# Patient Record
Sex: Male | Born: 1965 | Race: White | Hispanic: No | Marital: Married | Smoking: Former smoker
Health system: Southern US, Community
[De-identification: ages and names within clinical notes are randomized; demographics above are authoritative.]

## PROBLEM LIST (undated history)

## (undated) DIAGNOSIS — R519 Headache, unspecified: Secondary | ICD-10-CM

## (undated) DIAGNOSIS — G8929 Other chronic pain: Secondary | ICD-10-CM

## (undated) DIAGNOSIS — J449 Chronic obstructive pulmonary disease, unspecified: Secondary | ICD-10-CM

## (undated) DIAGNOSIS — Z9981 Dependence on supplemental oxygen: Secondary | ICD-10-CM

## (undated) DIAGNOSIS — J342 Deviated nasal septum: Secondary | ICD-10-CM

## (undated) DIAGNOSIS — R51 Headache: Secondary | ICD-10-CM

## (undated) DIAGNOSIS — E785 Hyperlipidemia, unspecified: Secondary | ICD-10-CM

## (undated) DIAGNOSIS — J3489 Other specified disorders of nose and nasal sinuses: Secondary | ICD-10-CM

## (undated) DIAGNOSIS — M549 Dorsalgia, unspecified: Secondary | ICD-10-CM

## (undated) DIAGNOSIS — K219 Gastro-esophageal reflux disease without esophagitis: Secondary | ICD-10-CM

## (undated) HISTORY — PX: KNEE SURGERY: SHX244

---

## 1997-08-03 ENCOUNTER — Emergency Department (HOSPITAL_COMMUNITY): Admission: EM | Admit: 1997-08-03 | Discharge: 1997-08-04 | Payer: Self-pay | Admitting: Emergency Medicine

## 1998-03-19 ENCOUNTER — Encounter: Payer: Self-pay | Admitting: Emergency Medicine

## 1998-03-19 ENCOUNTER — Emergency Department (HOSPITAL_COMMUNITY): Admission: EM | Admit: 1998-03-19 | Discharge: 1998-03-19 | Payer: Self-pay | Admitting: Emergency Medicine

## 2000-05-23 ENCOUNTER — Emergency Department (HOSPITAL_COMMUNITY): Admission: EM | Admit: 2000-05-23 | Discharge: 2000-05-23 | Payer: Self-pay | Admitting: *Deleted

## 2001-01-12 ENCOUNTER — Emergency Department (HOSPITAL_COMMUNITY): Admission: EM | Admit: 2001-01-12 | Discharge: 2001-01-12 | Payer: Self-pay | Admitting: Emergency Medicine

## 2001-01-28 ENCOUNTER — Ambulatory Visit (HOSPITAL_COMMUNITY): Admission: RE | Admit: 2001-01-28 | Discharge: 2001-01-28 | Payer: Self-pay | Admitting: Gastroenterology

## 2001-02-23 ENCOUNTER — Ambulatory Visit (HOSPITAL_COMMUNITY): Admission: RE | Admit: 2001-02-23 | Discharge: 2001-02-23 | Payer: Self-pay | Admitting: Gastroenterology

## 2010-09-13 ENCOUNTER — Other Ambulatory Visit: Payer: Self-pay | Admitting: Family Medicine

## 2010-09-13 DIAGNOSIS — R51 Headache: Secondary | ICD-10-CM

## 2010-09-14 ENCOUNTER — Ambulatory Visit
Admission: RE | Admit: 2010-09-14 | Discharge: 2010-09-14 | Disposition: A | Discharge status: Home / Self Care | Payer: PRIVATE HEALTH INSURANCE | Source: Ambulatory Visit | Attending: Family Medicine | Admitting: Family Medicine

## 2010-09-14 ENCOUNTER — Other Ambulatory Visit: Payer: Self-pay

## 2010-09-14 DIAGNOSIS — R51 Headache: Secondary | ICD-10-CM

## 2014-06-14 ENCOUNTER — Other Ambulatory Visit: Payer: Self-pay | Admitting: Family Medicine

## 2014-06-14 DIAGNOSIS — R51 Headache: Principal | ICD-10-CM

## 2014-06-14 DIAGNOSIS — R519 Headache, unspecified: Secondary | ICD-10-CM

## 2014-06-23 ENCOUNTER — Ambulatory Visit
Admission: RE | Admit: 2014-06-23 | Discharge: 2014-06-23 | Disposition: A | Discharge status: Home / Self Care | Payer: Medicare Other | Source: Ambulatory Visit | Attending: Family Medicine | Admitting: Family Medicine

## 2014-06-23 DIAGNOSIS — R519 Headache, unspecified: Secondary | ICD-10-CM

## 2014-06-23 DIAGNOSIS — R51 Headache: Principal | ICD-10-CM

## 2014-06-23 MED ORDER — GADOBENATE DIMEGLUMINE 529 MG/ML IV SOLN
20.0000 mL | Freq: Once | INTRAVENOUS | Status: AC | PRN
  Administered 2014-06-23: 20 mL via INTRAVENOUS

## 2017-02-21 ENCOUNTER — Ambulatory Visit: Payer: Self-pay | Admitting: Otolaryngology

## 2017-02-21 NOTE — H&P (Signed)
PREOPERATIVE H&P  Chief Complaint: Nasal obstruction  HPI: Tyler ScotMichael E Sobocinski is a 52 y.o. male who presents for evaluation of nasal obstruction and headaches. He also snores on a regular. He's been having headaches on top of his head for the last several weeks and has occasional clear drainage from his nose. Nasal exam reveals severely deviated septum but no evidence of active infection. He's taken to the operating room at this time for septoplasty and turbinate reductions. He has some clinical symptoms of obstructive sleep apnea. He's gained approximately 30 pounds over the last year since he stopped smoking.  No past medical history on file.  Social History   Socioeconomic History  . Marital status: Married    Spouse name: Not on file  . Number of children: Not on file  . Years of education: Not on file  . Highest education level: Not on file  Social Needs  . Financial resource strain: Not on file  . Food insecurity - worry: Not on file  . Food insecurity - inability: Not on file  . Transportation needs - medical: Not on file  . Transportation needs - non-medical: Not on file  Occupational History  . Not on file  Tobacco Use  . Smoking status: Not on file  Substance and Sexual Activity  . Alcohol use: Not on file  . Drug use: Not on file  . Sexual activity: Not on file  Other Topics Concern  . Not on file  Social History Narrative  . Not on file   No family history on file. Allergies not on file Prior to Admission medications   Not on File     Positive ROS: Per history of present illness  All other systems have been reviewed and were otherwise negative with the exception of those mentioned in the HPI and as above.  Physical Exam: There were no vitals filed for this visit.  General: Alert, no acute distress Oral: Normal oral mucosa and tonsils Nasal: Severe septal deviation to the right. No polyps noted. No signs of infection. Neck: No palpable adenopathy or  thyroid nodules Ear: Ear canal is clear with normal appearing TMs Cardiovascular: Regular rate and rhythm, no murmur.  Respiratory: Clear to auscultation Neurologic: Alert and oriented x 3   Assessment/Plan: deviated septum  chronic sinusitis Plan for Procedure(s): NASAL SEPTOPLASTY WITH BILATERAL TURBINATE REDUCTION   Dillard Cannonhristopher Page Pucciarelli, MD 02/21/2017 4:50 PM

## 2017-02-24 ENCOUNTER — Encounter (HOSPITAL_BASED_OUTPATIENT_CLINIC_OR_DEPARTMENT_OTHER): Payer: Self-pay | Admitting: *Deleted

## 2017-02-24 NOTE — Progress Notes (Signed)
Patient is on O2 @ 2L/[place] nightly and with naps for severe COPD. Chart reviewed with Dr Willa Fraterarrigan, he will need to be done at main OR. Dr Allene PyoNewman's office notified.

## 2017-03-04 MED ORDER — CEFAZOLIN SODIUM 10 G IJ SOLR
3.0000 g | INTRAMUSCULAR | Status: AC
  Filled 2017-03-04: qty 3000

## 2017-03-11 ENCOUNTER — Encounter (HOSPITAL_COMMUNITY): Payer: Self-pay | Admitting: *Deleted

## 2017-03-11 ENCOUNTER — Other Ambulatory Visit: Payer: Self-pay

## 2017-03-11 NOTE — Progress Notes (Signed)
Pt denies any acute cardiopulmonary issues. Pt not under the care of a cardiologist. Pt denies having a stress test, echo and cardiac cath.Pt denies having an EKG and chest x ray within the last year. Pt denies having recent labs. Pt made aware to stop taking  Aspirin, vitamins, fish oil and herbal medications. Do not take any NSAIDs ie: Ibuprofen, Advil, Naproxen (Aleve), Motrin, BC and Goody Powder or any medication containing Aspirin.

## 2017-03-12 ENCOUNTER — Ambulatory Visit (HOSPITAL_COMMUNITY): Payer: Medicare Other | Admitting: Anesthesiology

## 2017-03-12 ENCOUNTER — Ambulatory Visit: Payer: Self-pay | Admitting: Otolaryngology

## 2017-03-12 ENCOUNTER — Encounter (HOSPITAL_COMMUNITY): Payer: Self-pay

## 2017-03-12 ENCOUNTER — Ambulatory Visit (HOSPITAL_COMMUNITY)
Admission: RE | Admit: 2017-03-12 | Discharge: 2017-03-12 | Disposition: A | Discharge status: Home / Self Care | Payer: Medicare Other | Source: Ambulatory Visit | Attending: Otolaryngology | Admitting: Otolaryngology

## 2017-03-12 ENCOUNTER — Encounter (HOSPITAL_COMMUNITY)
Admission: RE | Disposition: A | Discharge status: Home / Self Care | Payer: Self-pay | Source: Ambulatory Visit | Attending: Otolaryngology

## 2017-03-12 DIAGNOSIS — Z87891 Personal history of nicotine dependence: Secondary | ICD-10-CM | POA: Diagnosis not present

## 2017-03-12 DIAGNOSIS — J3489 Other specified disorders of nose and nasal sinuses: Secondary | ICD-10-CM | POA: Diagnosis not present

## 2017-03-12 DIAGNOSIS — J329 Chronic sinusitis, unspecified: Secondary | ICD-10-CM | POA: Insufficient documentation

## 2017-03-12 DIAGNOSIS — J342 Deviated nasal septum: Secondary | ICD-10-CM | POA: Insufficient documentation

## 2017-03-12 HISTORY — DX: Headache: R51

## 2017-03-12 HISTORY — DX: Deviated nasal septum: J34.2

## 2017-03-12 HISTORY — DX: Dorsalgia, unspecified: M54.9

## 2017-03-12 HISTORY — DX: Gastro-esophageal reflux disease without esophagitis: K21.9

## 2017-03-12 HISTORY — DX: Dependence on supplemental oxygen: Z99.81

## 2017-03-12 HISTORY — DX: Other chronic pain: G89.29

## 2017-03-12 HISTORY — PX: NASAL SEPTOPLASTY W/ TURBINOPLASTY: SHX2070

## 2017-03-12 HISTORY — DX: Headache, unspecified: R51.9

## 2017-03-12 HISTORY — DX: Chronic obstructive pulmonary disease, unspecified: J44.9

## 2017-03-12 HISTORY — DX: Other specified disorders of nose and nasal sinuses: J34.89

## 2017-03-12 HISTORY — DX: Hyperlipidemia, unspecified: E78.5

## 2017-03-12 LAB — CBC
HCT: 43.4 % (ref 39.0–52.0)
HEMOGLOBIN: 15.3 g/dL (ref 13.0–17.0)
MCH: 32.5 pg (ref 26.0–34.0)
MCHC: 35.3 g/dL (ref 30.0–36.0)
MCV: 92.1 fL (ref 78.0–100.0)
Platelets: 264 10*3/uL (ref 150–400)
RBC: 4.71 MIL/uL (ref 4.22–5.81)
RDW: 13 % (ref 11.5–15.5)
WBC: 10.3 10*3/uL (ref 4.0–10.5)

## 2017-03-12 SURGERY — SEPTOPLASTY, NOSE, WITH NASAL TURBINATE REDUCTION
Anesthesia: General | Laterality: Bilateral

## 2017-03-12 MED ORDER — LIDOCAINE-EPINEPHRINE 1 %-1:100000 IJ SOLN
INTRAMUSCULAR | Status: DC | PRN
  Administered 2017-03-12: 8 mL

## 2017-03-12 MED ORDER — DEXAMETHASONE SODIUM PHOSPHATE 10 MG/ML IJ SOLN
INTRAMUSCULAR | Status: DC | PRN
  Administered 2017-03-12: 10 mg via INTRAVENOUS

## 2017-03-12 MED ORDER — LACTATED RINGERS IV SOLN
INTRAVENOUS | Status: DC | PRN
  Administered 2017-03-12: 14:00:00 via INTRAVENOUS

## 2017-03-12 MED ORDER — LIDOCAINE-EPINEPHRINE 1 %-1:100000 IJ SOLN
INTRAMUSCULAR | Status: AC
  Filled 2017-03-12: qty 1

## 2017-03-12 MED ORDER — OXYMETAZOLINE HCL 0.05 % NA SOLN
NASAL | Status: DC | PRN
  Administered 2017-03-12: 1

## 2017-03-12 MED ORDER — CEPHALEXIN 500 MG PO CAPS: 500.0000 mg | ORAL_CAPSULE | Freq: Two times a day (BID) | ORAL | 0 refills | Status: AC

## 2017-03-12 MED ORDER — MEPERIDINE HCL 25 MG/ML IJ SOLN: 6.2500 mg | INTRAMUSCULAR | Status: DC | PRN

## 2017-03-12 MED ORDER — LIDOCAINE-EPINEPHRINE (PF) 1 %-1:200000 IJ SOLN
INTRAMUSCULAR | Status: AC
  Filled 2017-03-12: qty 30

## 2017-03-12 MED ORDER — DEXAMETHASONE SODIUM PHOSPHATE 10 MG/ML IJ SOLN
INTRAMUSCULAR | Status: AC
  Filled 2017-03-12: qty 1

## 2017-03-12 MED ORDER — BACITRACIN ZINC 500 UNIT/GM EX OINT
TOPICAL_OINTMENT | CUTANEOUS | Status: AC
  Filled 2017-03-12: qty 28.35

## 2017-03-12 MED ORDER — PROPOFOL 10 MG/ML IV BOLUS
INTRAVENOUS | Status: DC | PRN
  Administered 2017-03-12: 200 mg via INTRAVENOUS
  Administered 2017-03-12: 30 mg via INTRAVENOUS

## 2017-03-12 MED ORDER — SODIUM CHLORIDE 0.9 % IR SOLN
Status: DC | PRN
  Administered 2017-03-12: 1000 mL

## 2017-03-12 MED ORDER — ROCURONIUM BROMIDE 100 MG/10ML IV SOLN
INTRAVENOUS | Status: DC | PRN
  Administered 2017-03-12: 45 mg via INTRAVENOUS

## 2017-03-12 MED ORDER — HYDROMORPHONE HCL 1 MG/ML IJ SOLN
0.2500 mg | INTRAMUSCULAR | Status: DC | PRN
  Administered 2017-03-12 (×3): 0.5 mg via INTRAVENOUS

## 2017-03-12 MED ORDER — OXYMETAZOLINE HCL 0.05 % NA SOLN
NASAL | Status: AC
  Filled 2017-03-12: qty 15

## 2017-03-12 MED ORDER — PROPOFOL 10 MG/ML IV BOLUS
INTRAVENOUS | Status: AC
  Filled 2017-03-12: qty 20

## 2017-03-12 MED ORDER — CEFAZOLIN SODIUM-DEXTROSE 1-4 GM/50ML-% IV SOLN
1.0000 g | Freq: Once | INTRAVENOUS | Status: AC
  Administered 2017-03-12: 1 g via INTRAVENOUS

## 2017-03-12 MED ORDER — CHLORHEXIDINE GLUCONATE CLOTH 2 % EX PADS: 6.0000 | MEDICATED_PAD | Freq: Once | CUTANEOUS | Status: DC

## 2017-03-12 MED ORDER — CEFAZOLIN SODIUM-DEXTROSE 2-4 GM/100ML-% IV SOLN
2.0000 g | INTRAVENOUS | Status: AC
  Administered 2017-03-12: 2 g via INTRAVENOUS
  Filled 2017-03-12: qty 100

## 2017-03-12 MED ORDER — ONDANSETRON HCL 4 MG/2ML IJ SOLN
INTRAMUSCULAR | Status: DC | PRN
  Administered 2017-03-12: 4 mg via INTRAVENOUS

## 2017-03-12 MED ORDER — MIDAZOLAM HCL 2 MG/2ML IJ SOLN
INTRAMUSCULAR | Status: AC
  Filled 2017-03-12: qty 2

## 2017-03-12 MED ORDER — LIDOCAINE 2% (20 MG/ML) 5 ML SYRINGE
INTRAMUSCULAR | Status: DC | PRN
  Administered 2017-03-12: 100 mg via INTRAVENOUS

## 2017-03-12 MED ORDER — HYDROMORPHONE HCL 1 MG/ML IJ SOLN
INTRAMUSCULAR | Status: AC
  Filled 2017-03-12: qty 1

## 2017-03-12 MED ORDER — PROMETHAZINE HCL 25 MG/ML IJ SOLN: 6.2500 mg | INTRAMUSCULAR | Status: DC | PRN

## 2017-03-12 MED ORDER — ONDANSETRON HCL 4 MG/2ML IJ SOLN
INTRAMUSCULAR | Status: AC
  Filled 2017-03-12: qty 2

## 2017-03-12 MED ORDER — ROCURONIUM BROMIDE 10 MG/ML (PF) SYRINGE
PREFILLED_SYRINGE | INTRAVENOUS | Status: AC
  Filled 2017-03-12: qty 5

## 2017-03-12 MED ORDER — SUGAMMADEX SODIUM 200 MG/2ML IV SOLN
INTRAVENOUS | Status: AC
Start: 2017-03-12 — End: ?
  Filled 2017-03-12: qty 2

## 2017-03-12 MED ORDER — HYDROCODONE-ACETAMINOPHEN 7.5-325 MG PO TABS: 1.0000 | ORAL_TABLET | Freq: Once | ORAL | Status: DC | PRN

## 2017-03-12 MED ORDER — HYDROCODONE-ACETAMINOPHEN 5-325 MG PO TABS: 1.0000 | ORAL_TABLET | ORAL | 0 refills | Status: AC | PRN

## 2017-03-12 MED ORDER — LIDOCAINE 2% (20 MG/ML) 5 ML SYRINGE
INTRAMUSCULAR | Status: AC
  Filled 2017-03-12: qty 5

## 2017-03-12 MED ORDER — FENTANYL CITRATE (PF) 250 MCG/5ML IJ SOLN
INTRAMUSCULAR | Status: AC
  Filled 2017-03-12: qty 5

## 2017-03-12 MED ORDER — FENTANYL CITRATE (PF) 100 MCG/2ML IJ SOLN
INTRAMUSCULAR | Status: DC | PRN
  Administered 2017-03-12 (×2): 50 ug via INTRAVENOUS
  Administered 2017-03-12 (×2): 100 ug via INTRAVENOUS
  Administered 2017-03-12: 50 ug via INTRAVENOUS
  Administered 2017-03-12: 100 ug via INTRAVENOUS

## 2017-03-12 MED ORDER — ACETAMINOPHEN 10 MG/ML IV SOLN: 1000.0000 mg | Freq: Once | INTRAVENOUS | Status: DC | PRN

## 2017-03-12 MED ORDER — MIDAZOLAM HCL 2 MG/2ML IJ SOLN
INTRAMUSCULAR | Status: DC | PRN
  Administered 2017-03-12: 2 mg via INTRAVENOUS

## 2017-03-12 MED ORDER — BACITRACIN ZINC 500 UNIT/GM EX OINT
TOPICAL_OINTMENT | CUTANEOUS | Status: DC | PRN
  Administered 2017-03-12: 1 via TOPICAL

## 2017-03-12 MED ORDER — CEFAZOLIN SODIUM-DEXTROSE 1-4 GM/50ML-% IV SOLN
INTRAVENOUS | Status: AC
  Filled 2017-03-12: qty 50

## 2017-03-12 MED ORDER — SODIUM CHLORIDE 0.9 % IJ SOLN
INTRAMUSCULAR | Status: DC | PRN
  Administered 2017-03-12: 10 mL

## 2017-03-12 SURGICAL SUPPLY — 48 items
BLADE INF TURB ROT M4 2 5PK (BLADE) ×2 IMPLANT
BLADE INF TURB ROT M4 2MM 5PK (BLADE) ×1
BLADE SURG 15 STRL LF DISP TIS (BLADE) ×1 IMPLANT
BLADE SURG 15 STRL SS (BLADE) ×2
CANISTER SUCT 3000ML PPV (MISCELLANEOUS) ×3 IMPLANT
CLEANER TIP ELECTROSURG 2X2 (MISCELLANEOUS) ×3 IMPLANT
CLOSURE WOUND 1/2 X4 (GAUZE/BANDAGES/DRESSINGS)
COAGULATOR SUCT 6 FR SWTCH (ELECTROSURGICAL)
COAGULATOR SUCT 8FR VV (MISCELLANEOUS) ×3 IMPLANT
COAGULATOR SUCT SWTCH 10FR 6 (ELECTROSURGICAL) IMPLANT
DRAPE HALF SHEET 40X57 (DRAPES) ×3 IMPLANT
DRESSING NASAL KENNEDY 3.5X.9 (MISCELLANEOUS) IMPLANT
DRESSING TELFA 8X10 (GAUZE/BANDAGES/DRESSINGS) ×3 IMPLANT
DRSG NASAL KENNEDY 3.5X.9 (MISCELLANEOUS)
ELECT COATED BLADE 2.86 ST (ELECTRODE) IMPLANT
ELECT REM PT RETURN 9FT ADLT (ELECTROSURGICAL)
ELECTRODE REM PT RTRN 9FT ADLT (ELECTROSURGICAL) IMPLANT
GAUZE SPONGE 2X2 8PLY STRL LF (GAUZE/BANDAGES/DRESSINGS) IMPLANT
GAUZE SPONGE 4X4 12PLY STRL (GAUZE/BANDAGES/DRESSINGS) ×3 IMPLANT
GLOVE SS BIOGEL STRL SZ 7.5 (GLOVE) ×1 IMPLANT
GLOVE SUPERSENSE BIOGEL SZ 7.5 (GLOVE) ×2
GOWN STRL REUS W/ TWL LRG LVL3 (GOWN DISPOSABLE) ×1 IMPLANT
GOWN STRL REUS W/ TWL XL LVL3 (GOWN DISPOSABLE) ×1 IMPLANT
GOWN STRL REUS W/TWL LRG LVL3 (GOWN DISPOSABLE) ×2
GOWN STRL REUS W/TWL XL LVL3 (GOWN DISPOSABLE) ×2
KIT BASIN OR (CUSTOM PROCEDURE TRAY) ×3 IMPLANT
KIT ROOM TURNOVER OR (KITS) ×3 IMPLANT
NEEDLE HYPO 25GX1X1/2 BEV (NEEDLE) ×3 IMPLANT
NEEDLE PRECISIONGLIDE 27X1.5 (NEEDLE) ×3 IMPLANT
NS IRRIG 1000ML POUR BTL (IV SOLUTION) ×3 IMPLANT
PAD ARMBOARD 7.5X6 YLW CONV (MISCELLANEOUS) ×6 IMPLANT
PATTIES SURGICAL .5 X3 (DISPOSABLE) ×3 IMPLANT
PENCIL FOOT CONTROL (ELECTRODE) IMPLANT
SPLINT NASAL DOYLE BI-VL (GAUZE/BANDAGES/DRESSINGS) ×3 IMPLANT
SPONGE GAUZE 2X2 STER 10/PKG (GAUZE/BANDAGES/DRESSINGS)
STRIP CLOSURE SKIN 1/2X4 (GAUZE/BANDAGES/DRESSINGS) IMPLANT
SUT CHROMIC 3 0 PS 2 (SUTURE) IMPLANT
SUT CHROMIC 4 0 PS 2 18 (SUTURE) ×6 IMPLANT
SUT CHROMIC 5 0 P 3 (SUTURE) IMPLANT
SUT ETHILON 3 0 PS 1 (SUTURE) ×3 IMPLANT
SUT ETHILON 4 0 PS 2 18 (SUTURE) IMPLANT
SUT ETHILON 5 0 P 3 18 (SUTURE)
SUT NYLON ETHILON 5-0 P-3 1X18 (SUTURE) IMPLANT
SUT SILK 2 0 PERMA HAND 18 BK (SUTURE) ×3 IMPLANT
TRAY ENT MC OR (CUSTOM PROCEDURE TRAY) ×3 IMPLANT
TUBE CONNECTING 12'X1/4 (SUCTIONS) ×1
TUBE CONNECTING 12X1/4 (SUCTIONS) ×2 IMPLANT
WATER STERILE IRR 1000ML POUR (IV SOLUTION) IMPLANT

## 2017-03-12 NOTE — Anesthesia Procedure Notes (Signed)
Procedure Name: Intubation Date/Time: 03/12/2017 2:21 PM Performed by: Moshe Salisbury, CRNA Pre-anesthesia Checklist: Patient identified, Emergency Drugs available, Suction available and Patient being monitored Patient Re-evaluated:Patient Re-evaluated prior to induction Oxygen Delivery Method: Circle System Utilized Preoxygenation: Pre-oxygenation with 100% oxygen Induction Type: IV induction Ventilation: Mask ventilation without difficulty Laryngoscope Size: Mac and 4 Grade View: Grade II Tube type: Oral Tube size: 8.0 mm Number of attempts: 1 Airway Equipment and Method: Stylet Placement Confirmation: ETT inserted through vocal cords under direct vision,  positive ETCO2 and breath sounds checked- equal and bilateral Secured at: 22 cm Tube secured with: Tape Dental Injury: Teeth and Oropharynx as per pre-operative assessment

## 2017-03-12 NOTE — Brief Op Note (Signed)
03/12/2017  4:13 PM  PATIENT:  Tyler Allen  52 y.o. male  PRE-OPERATIVE DIAGNOSIS:  deviated septum  chronic sinusitis  POST-OPERATIVE DIAGNOSIS:  deviated septum  chronic sinusitis  PROCEDURE:  Procedure(s): NASAL SEPTOPLASTY WITH BILATERAL TURBINATE REDUCTION (Bilateral)  SURGEON:  Surgeon(s) and Role:    Drema Halon* Brocha Gilliam E, MD - Primary  PHYSICIAN ASSISTANT:   ASSISTANTS: none   ANESTHESIA:   general  EBL:  15 mL   BLOOD ADMINISTERED:none  DRAINS: none   LOCAL MEDICATIONS USED:  XYLOCAINE with EPI 12 cc  SPECIMEN:  No Specimen  DISPOSITION OF SPECIMEN:  N/A  COUNTS:  YES  TOURNIQUET:  * No tourniquets in log *  DICTATION: .Other Dictation: Dictation Number [id number]  PLAN OF CARE: Admit for overnight observation  PATIENT DISPOSITION:  PACU - hemodynamically stable.   Delay start of Pharmacological VTE agent (>24hrs) due to surgical blood loss or risk of bleeding: yes

## 2017-03-12 NOTE — Anesthesia Postprocedure Evaluation (Signed)
Anesthesia Post Note  Patient: Marcelino ScotMichael E Aamodt  Procedure(s) Performed: NASAL SEPTOPLASTY WITH BILATERAL TURBINATE REDUCTION (Bilateral )     Patient location during evaluation: PACU Anesthesia Type: General Level of consciousness: awake and alert Pain management: pain level controlled Vital Signs Assessment: post-procedure vital signs reviewed and stable Respiratory status: spontaneous breathing, nonlabored ventilation, respiratory function stable and patient connected to nasal cannula oxygen Cardiovascular status: blood pressure returned to baseline and stable Postop Assessment: no apparent nausea or vomiting Anesthetic complications: no    Last Vitals:  Vitals:   03/12/17 1832 03/12/17 1835  BP:    Pulse: (!) 103   Resp: 11   Temp:  36.6 C  SpO2: 96%     Last Pain:  Vitals:   03/12/17 1730  TempSrc:   PainSc: 9                  Trevor IhaStephen A Rooney Gladwin

## 2017-03-12 NOTE — Anesthesia Preprocedure Evaluation (Addendum)
Anesthesia Evaluation  Patient identified by MRN, date of birth, ID band Patient awake    Reviewed: Allergy & Precautions, NPO status , Patient's Chart, lab work & pertinent test results  Airway Mallampati: II  TM Distance: >3 FB Neck ROM: Full    Dental no notable dental hx.    Pulmonary neg pulmonary ROS, COPD, former smoker,    Pulmonary exam normal breath sounds clear to auscultation       Cardiovascular negative cardio ROS Normal cardiovascular exam Rhythm:Regular Rate:Normal     Neuro/Psych negative neurological ROS  negative psych ROS   GI/Hepatic negative GI ROS, Neg liver ROS,   Endo/Other  negative endocrine ROS  Renal/GU negative Renal ROS  negative genitourinary   Musculoskeletal negative musculoskeletal ROS (+)   Abdominal   Peds  Hematology negative hematology ROS (+)   Anesthesia Other Findings   Reproductive/Obstetrics                            Anesthesia Physical Anesthesia Plan  ASA: II  Anesthesia Plan: General   Post-op Pain Management:    Induction: Intravenous  PONV Risk Score and Plan: 3 and Treatment may vary due to age or medical condition, Ondansetron and Dexamethasone  Airway Management Planned:   Additional Equipment:   Intra-op Plan:   Post-operative Plan: Extubation in OR  Informed Consent: I have reviewed the patients History and Physical, chart, labs and discussed the procedure including the risks, benefits and alternatives for the proposed anesthesia with the patient or authorized representative who has indicated his/her understanding and acceptance.   Dental advisory given  Plan Discussed with: CRNA  Anesthesia Plan Comments:         Anesthesia Quick Evaluation

## 2017-03-12 NOTE — Transfer of Care (Signed)
Immediate Anesthesia Transfer of Care Note  Patient: Tyler Allen  Procedure(s) Performed: NASAL SEPTOPLASTY WITH BILATERAL TURBINATE REDUCTION (Bilateral )  Patient Location: PACU  Anesthesia Type:General  Level of Consciousness: awake, oriented and patient cooperative  Airway & Oxygen Therapy: Patient Spontanous Breathing and Patient connected to face mask oxygen  Post-op Assessment: Report given to RN, Post -op Vital signs reviewed and stable and Patient moving all extremities  Post vital signs: Reviewed and stable  Last Vitals:  Vitals:   03/12/17 1128 03/12/17 1145  BP:  (!) 166/92  Pulse:  92  Resp: 18   Temp: 36.7 C   SpO2: 99%     Last Pain:  Vitals:   03/12/17 1128  TempSrc: Oral      Patients Stated Pain Goal: 0 (03/12/17 1145)  Complications: No apparent anesthesia complications

## 2017-03-12 NOTE — Interval H&P Note (Signed)
History and Physical Interval Note:  03/12/2017 1:47 PM  Tyler Allen  has presented today for surgery, with the diagnosis of deviated septum  chronic sinusitis  The various methods of treatment have been discussed with the patient and family. After consideration of risks, benefits and other options for treatment, the patient has consented to  Procedure(s): NASAL SEPTOPLASTY WITH BILATERAL TURBINATE REDUCTION (Bilateral) as a surgical intervention .  The patient's history has been reviewed, patient examined, no change in status, stable for surgery.  I have reviewed the patient's chart and labs.  Questions were answered to the patient's satisfaction.     Dillard Cannonhristopher Whitlee Sluder

## 2017-03-12 NOTE — Interval H&P Note (Signed)
History and Physical Interval Note:  03/12/2017 1:46 PM  Tyler Allen  has presented today for surgery, with the diagnosis of deviated septum  chronic sinusitis  The various methods of treatment have been discussed with the patient and family. After consideration of risks, benefits and other options for treatment, the patient has consented to  Procedure(s): NASAL SEPTOPLASTY WITH BILATERAL TURBINATE REDUCTION (Bilateral) as a surgical intervention .  The patient's history has been reviewed, patient examined, no change in status, stable for surgery.  I have reviewed the patient's chart and labs.  Questions were answered to the patient's satisfaction.     Dillard Cannonhristopher Ziana Heyliger

## 2017-03-12 NOTE — Discharge Instructions (Signed)
Take keflex 500mg  twice per day starting tomorrow Tylenol, motrin or hydrocodone 5 mg every 4 hrs prn pain Return to see Dr Ezzard StandingNewman at his office tomorrow at 1 pm Can apply cool compress to nose if you nave much bleeding from nose. Change gauze drip pad as needed.

## 2017-03-13 ENCOUNTER — Encounter (HOSPITAL_COMMUNITY): Payer: Self-pay | Admitting: Otolaryngology

## 2017-03-13 NOTE — Op Note (Signed)
NAME:  Tyler Allen, Tyler Allen                 ACCOUNT NO.:  MEDICAL RECORD NO.:  [id number]  LOCATION:                                 FACILITY:  PHYSICIAN:  Tyler Garbe. Ezzard Standing, M.D. DATE OF BIRTH:  DATE OF PROCEDURE:  03/12/2017 DATE OF DISCHARGE:                              OPERATIVE REPORT   PREOPERATIVE DIAGNOSIS:  Chronic nasal obstruction with severe right septal deviation and turbinate hypertrophy.  POSTOPERATIVE DIAGNOSIS:  Chronic nasal obstruction with severe right septal deviation and turbinate hypertrophy.  OPERATION PERFORMED:  Septoplasty with bilateral inferior turbinate reductions.  SURGEON:  Tyler Garbe. Ezzard Standing, MD.  ANESTHESIA:  General endotracheal.  COMPLICATIONS:  None.  BRIEF CLINICAL NOTE:  Tyler Allen is a 52 year old gentleman who has had a long history of chronic nasal sinus complaints.  He has always had trouble breathing through his nose and frequently has a lot of clear mucus discharge from his nose.  He has had frequent headaches.  He is obese and has clinical symptoms of obstructive sleep apnea.  He snores on a regular basis.  He has gained 30 pounds over the last year since stopping smoking.  On clinical exam, he has a very severe septal deviation to the right with almost total obstruction of the right nasal airway and has mucosal swelling and partial obstruction of the left airway.  He is taken to the operating room at this time for septoplasty and turbinate reductions.  DESCRIPTION OF PROCEDURE:  After adequate endotracheal anesthesia, the nose was prepped with Betadine solution and draped out with sterile towels.  The nose was then further prepped with cotton pledgets soaked in Afrin and the septum and turbinates were injected with Xylocaine with epinephrine for hemostasis.  On exam, the patient had a very severely deviated anterior cartilaginous septum, but septum appeared to probably have been fractured previously in a vertical plane where  the buccal portion basically touched the lateral wall of the nostril on the right side.  Had a concavity on the left side.  An incision was made along the cartilaginous septum on the right side of the mucoperiosteum and the mucoperiosteal flaps were elevated on either side of the severely deviated septum.  This was carried out posteriorly.  Portion of the cartilaginous septum that bowed off the maxillary crest into the right airway and this was removed as a strip along the floor of the cartilaginous septum.  Then, on the anterior severely buckled area, a vertical incision was made and a portion of the cartilaginous septum that bowed severely into the right side had to be removed.  A new pocket was placed between the medial crura anteriorly in order to unbuckle the cartilaginous septum and placed into the pocket between the medial crura inferiorly.  More posteriorly, the bony septum had a bony spur on the right side that was removed.  In addition, some of the bony septum superiorly that bowed more to the left side was removed with [name].  This allowed the septum to return much more towards the midline.  A single 4-0 chromic suture was placed at the distal end of the cartilaginous septum and it was brought out through the columella  anteriorly in order to pull the anterior cartilaginous septum into the pocket between the medial crura.  The hemitransfixion incision was closed with interrupted 4-0 chromic sutures.  The septum was basted with a 4-0 chromic suture and then splints were secured to either side of the septum with a 3-0 nylon suture.  Inferior turbinates were performed using a [employer] turbinate blade bilaterally.  The turbinates were outfractured and then suction cautery was used for hemostasis and cauterized the posterior portion of the inferior turbinates.  After turbinate reductions, the nose was packed with Telfa soaked in bacitracin ointment placed along  the floor of the nose bilaterally. This was secured anteriorly with a single 2-0 silk suture.  The oropharynx was aspirated of any mucus and blood, which was minimal.  The patient was awoken from anesthesia and transferred to the recovery room postoperatively doing well.  DISPOSITION:  The patient will be placed on Keflex 500 mg b.i.d. for a week in addition to hydrocodone tablets 5 mg 1 q.4 hours p.r.n. pain. We will plan on removing his nasal packing tomorrow and have him follow up in 1 week to have his splints removed.          ______________________________ Tyler Allen, M.D.     CEN/MEDQ  D:  03/12/2017  T:  03/13/2017  Job:  [id number]

## 2017-09-19 ENCOUNTER — Ambulatory Visit: Payer: Medicare Other | Admitting: Diagnostic Neuroimaging

## 2017-11-21 ENCOUNTER — Ambulatory Visit: Payer: Medicare Other | Admitting: Diagnostic Neuroimaging

## 2019-10-21 ENCOUNTER — Other Ambulatory Visit: Payer: Self-pay

## 2019-10-21 ENCOUNTER — Ambulatory Visit: Payer: Medicare Other | Admitting: Cardiology

## 2019-10-21 ENCOUNTER — Encounter: Payer: Self-pay | Admitting: Cardiology

## 2019-10-21 VITALS — BP 137/84 | HR 89 | Resp 17 | Ht 69.0 in | Wt 249.0 lb

## 2019-10-21 DIAGNOSIS — R0789 Other chest pain: Secondary | ICD-10-CM | POA: Insufficient documentation

## 2019-10-21 DIAGNOSIS — Z8249 Family history of ischemic heart disease and other diseases of the circulatory system: Secondary | ICD-10-CM

## 2019-10-21 DIAGNOSIS — R079 Chest pain, unspecified: Secondary | ICD-10-CM

## 2019-10-21 MED ORDER — NITROGLYCERIN 0.4 MG SL SUBL: 0.4000 mg | SUBLINGUAL_TABLET | SUBLINGUAL | 2 refills | Status: DC | PRN

## 2019-10-21 MED ORDER — METOPROLOL TARTRATE 25 MG PO TABS: 25.0000 mg | ORAL_TABLET | Freq: Two times a day (BID) | ORAL | 2 refills | Status: DC

## 2019-10-21 MED ORDER — ASPIRIN EC 81 MG PO TBEC: 81.0000 mg | DELAYED_RELEASE_TABLET | Freq: Every day | ORAL | 2 refills | Status: DC

## 2019-10-21 NOTE — Patient Instructions (Signed)
Your cardiac CT will be scheduled at the locations below:   [facility]  [address]  [place], [place] [address]  [phone]    If scheduled at [facility], please arrive at the [facility] main entrance of [facility] 30-45 minutes prior to test start time.  Proceed to the [facility] Radiology Department (first floor) to check-in and test prep.   Please follow these instructions carefully (unless otherwise directed):    Hold all erectile dysfunction medications at least 3 days (72 hrs) prior to test.   On the Night Before the Test:   Be sure to Drink plenty of water.   Do not consume any caffeinated/decaffeinated beverages or chocolate 12 hours prior to your test.   Do not take any antihistamines 12 hours prior to your test.   If the patient has contrast allergy:  Patient will need a prescription for Prednisone and very clear instructions (as follows):  1. Prednisone 50 mg - take 13 hours prior to test  2. Take another Prednisone 50 mg 7 hours prior to test  3. Take another Prednisone 50 mg 1 hour prior to test  4. Take Benadryl 50 mg 1 hour prior to test   Patient must complete all four doses of above prophylactic medications.   Patient will need a ride after test due to Benadryl.   On the Day of the Test:   Drink plenty of water. Do not drink any water within one hour of the test.   Do not eat any food 4 hours prior to the test.   You may take your regular medications prior to the test.    - Metoprolol tartarate Take 2 pills of 25 mg  2 hours before CT scan You may stop it after the CT scan, unless specified otherwise by me.           After the Test:   Drink plenty of water.   After receiving IV contrast, you may experience a mild flushed feeling. This is normal.   On occasion, you may experience a mild rash up to 24 hours after the test. This is not dangerous. If this occurs, you can take Benadryl 25 mg and increase your  fluid intake.   If you experience trouble breathing, this can be serious. If it is severe call 911 IMMEDIATELY. If it is mild, please call our office.   If you take any of these medications: Glipizide/Metformin, Avandament, Glucavance, please do not take 48 hours after completing test unless otherwise instructed.     Please contact the cardiac imaging nurse navigator should you have any questions/concerns  Rockwell Alexandria, RN Navigator Cardiac Imaging  [facility]  [phone] Office  [phone] Cell

## 2019-10-21 NOTE — Progress Notes (Signed)
Patient referred by [name], * for chest pain  Subjective:   Tyler Allen, male    DOB: 03/03/1965, 54 y.o.   MRN: [id number]   Chief Complaint  Patient presents with  . Chest Pain  . New Patient (Initial Visit)     HPI  54 y.o. Caucasian male with COPD, former smoker, type 2 DM, family h/o early CAD, referred for evaluation of chest pain.  Patient has been disabled since 1991 due to work related back injury. His physical activity is limited to walking to grocery stores etc. For last months, he has been experiencing chest pain. Pain is retrosternal, starts on right side, radiates to left side. Pain only relieves when e lays down. He has stable exertional dyspnea, attributed to COPD. He quit smoking in 2018.    Past Medical History:  Diagnosis Date  . Chronic back pain   . COPD (chronic obstructive pulmonary disease) (HCC)   . Dependence on supplemental oxygen    on O2 @ 2L nightly and with naps  . Deviated septum   . GERD (gastroesophageal reflux disease)   . Headache   . Hyperlipidemia   . Nasal obstruction    chronic     Past Surgical History:  Procedure Laterality Date  . KNEE SURGERY    . NASAL SEPTOPLASTY W/ TURBINOPLASTY Bilateral 03/12/2017   Procedure: NASAL SEPTOPLASTY WITH BILATERAL TURBINATE REDUCTION;  Surgeon: Drema Halon, MD;  Location: [facility] OR;  Service: ENT;  Laterality: Bilateral;     Social History   Tobacco Use  Smoking Status Former Smoker  . Packs/day: 1.50  . Years: 40.00  . Pack years: 60.00  . Types: Cigarettes  . Quit date: 2017  . Years since quitting: 4.6  Smokeless Tobacco Never Used  Tobacco Comment   quit 1 yr ago 2018    Social History   Substance and Sexual Activity  Alcohol Use No     Family History  Problem Relation Age of Onset  . Hyperlipidemia Mother   . Diabetes Mother   . Heart disease Father   . Asthma Father   . Diabetes Brother      Current Outpatient Medications on  File Prior to Visit  Medication Sig Dispense Refill  . acetaminophen-codeine (TYLENOL #4) 300-60 MG tablet Take 1 tablet by mouth every 4 (four) hours as needed for pain.    Marland Kitchen albuterol (PROVENTIL) (5 MG/ML) 0.5% nebulizer solution Take 2.5 mg by nebulization every 6 (six) hours as needed for wheezing or shortness of breath.    . cephALEXin (KEFLEX) 500 MG capsule Take 1 capsule (500 mg total) by mouth 2 (two) times daily. 14 capsule 0  . HYDROcodone-acetaminophen (NORCO/VICODIN) 5-325 MG tablet Take 1 tablet by mouth every 4 (four) hours as needed for moderate pain. 20 tablet 0  . lidocaine (LIDODERM) 5 % 2 patches daily.    Marland Kitchen lovastatin (MEVACOR) 20 MG tablet Take 20 mg by mouth at bedtime.    . metFORMIN (GLUCOPHAGE) 1000 MG tablet Take 1 tablet by mouth in the morning and at bedtime.    . tamsulosin (FLOMAX) 0.4 MG CAPS capsule Take 0.8 mg by mouth daily.    . traZODone (DESYREL) 150 MG tablet Take 1 tablet by mouth daily.     No current facility-administered medications on file prior to visit.    Cardiovascular and other pertinent studies:  EKG 10/21/2019: Sinus rhythm 88 bpm  Borderline left atrial dilatation   Recent labs: Not available  Review of Systems  Cardiovascular: Positive for chest pain. Negative for dyspnea on exertion, leg swelling, palpitations and syncope.         Vitals:   10/21/19 1456  BP: 137/84  Pulse: 89  Resp: 17  SpO2: 99%     Body mass index is 36.77 kg/m. Filed Weights   10/21/19 1456  Weight: 249 lb (112.9 kg)     Objective:   Physical Exam Vitals and nursing note reviewed.  Constitutional:      General: He is not in acute distress. Neck:     Vascular: No JVD.  Cardiovascular:     Rate and Rhythm: Normal rate and regular rhythm.     Heart sounds: Normal heart sounds. No murmur heard.   Pulmonary:     Effort: Pulmonary effort is normal.     Breath sounds: Normal breath sounds. No wheezing or rales.             Assessment & Recommendations:   54 y.o. Caucasian male with COPD, former smoker, type 2 DM, family h/o early CAD, referred for evaluation of chest pain.  Chest pain: Rather atypical symptoms. However, he has risk factors for CAD with family history, prior tobacco use, diabetes. Not a treadmill candidate due to back issues. Will obtain coronary CTA. In the meantime, recommend Aspirin 81, metoprolol tartarate 25 mg bid, nitroglycerin as needed. Okay to continue lovastatin for now. Will change to high intensity statin if he has CAD. Check BMP, lipid panel.  F/u after testing   Thank you for referring the patient to [place]. Please feel free to contact with any questions.   Elder Negus, MD Pager: [phone] Office: [phone]

## 2019-11-01 ENCOUNTER — Other Ambulatory Visit (HOSPITAL_COMMUNITY): Payer: Self-pay | Admitting: Cardiology

## 2019-11-02 ENCOUNTER — Ambulatory Visit: Payer: Medicare Other

## 2019-11-02 ENCOUNTER — Other Ambulatory Visit: Payer: Self-pay

## 2019-11-02 DIAGNOSIS — R0789 Other chest pain: Secondary | ICD-10-CM

## 2019-11-02 LAB — BASIC METABOLIC PANEL
BUN/Creatinine Ratio: 25 — ABNORMAL HIGH (ref 9–20)
BUN: 21 mg/dL (ref 6–24)
CO2: 22 mmol/L (ref 20–29)
Calcium: 9.4 mg/dL (ref 8.7–10.2)
Chloride: 103 mmol/L (ref 96–106)
Creatinine, Ser: 0.84 mg/dL (ref 0.76–1.27)
GFR calc Af Amer: 117 mL/min/{1.73_m2} (ref >59)
GFR calc non Af Amer: 101 mL/min/{1.73_m2} (ref >59)
Glucose: 95 mg/dL (ref 65–99)
Potassium: 4.3 mmol/L (ref 3.5–5.2)
Sodium: 138 mmol/L (ref 134–144)

## 2019-11-02 LAB — LIPID PANEL
Chol/HDL Ratio: 6.2 ratio — ABNORMAL HIGH (ref 0.0–5.0)
Cholesterol, Total: 229 mg/dL — ABNORMAL HIGH (ref 100–199)
HDL: 37 mg/dL — ABNORMAL LOW (ref >39)
LDL Chol Calc (NIH): 161 mg/dL — ABNORMAL HIGH (ref 0–99)
Triglycerides: 168 mg/dL — ABNORMAL HIGH (ref 0–149)
VLDL Cholesterol Cal: 31 mg/dL (ref 5–40)

## 2019-11-16 ENCOUNTER — Telehealth (HOSPITAL_COMMUNITY): Payer: Self-pay | Admitting: *Deleted

## 2019-11-16 NOTE — Telephone Encounter (Signed)
Pt returning call regarding upcoming cardiac imaging study; pt verbalizes understanding of appt date/time, parking situation and where to check in, pre-test NPO status and medications ordered, and verified current allergies; name and call back number provided for further questions should they arise  Akbar Sacra Tai RN Navigator Cardiac Imaging [facility] [phone] office [phone] cell  

## 2019-11-16 NOTE — Telephone Encounter (Signed)
Attempted to call patient regarding upcoming cardiac CT appointment. Left message on voicemail with name and callback number  Danni Shima Tai RN Navigator Cardiac Imaging [place] [facility] [phone] Office [phone] Cell  

## 2019-11-17 ENCOUNTER — Other Ambulatory Visit: Payer: Self-pay

## 2019-11-17 ENCOUNTER — Ambulatory Visit (HOSPITAL_COMMUNITY)
Admission: RE | Admit: 2019-11-17 | Discharge: 2019-11-17 | Disposition: A | Discharge status: Home / Self Care | Payer: Medicare Other | Source: Ambulatory Visit | Attending: Cardiology | Admitting: Cardiology

## 2019-11-17 DIAGNOSIS — Z87891 Personal history of nicotine dependence: Secondary | ICD-10-CM | POA: Diagnosis not present

## 2019-11-17 DIAGNOSIS — R0789 Other chest pain: Secondary | ICD-10-CM | POA: Insufficient documentation

## 2019-11-17 DIAGNOSIS — J449 Chronic obstructive pulmonary disease, unspecified: Secondary | ICD-10-CM | POA: Diagnosis not present

## 2019-11-17 DIAGNOSIS — R918 Other nonspecific abnormal finding of lung field: Secondary | ICD-10-CM | POA: Insufficient documentation

## 2019-11-17 DIAGNOSIS — Z8249 Family history of ischemic heart disease and other diseases of the circulatory system: Secondary | ICD-10-CM | POA: Insufficient documentation

## 2019-11-17 DIAGNOSIS — E119 Type 2 diabetes mellitus without complications: Secondary | ICD-10-CM | POA: Diagnosis not present

## 2019-11-17 DIAGNOSIS — Z736 Limitation of activities due to disability: Secondary | ICD-10-CM | POA: Diagnosis not present

## 2019-11-17 MED ORDER — NITROGLYCERIN 0.4 MG SL SUBL
0.8000 mg | SUBLINGUAL_TABLET | Freq: Once | SUBLINGUAL | Status: AC
  Administered 2019-11-17: 14:00:00 0.8 mg via SUBLINGUAL

## 2019-11-17 MED ORDER — IOHEXOL 350 MG/ML SOLN
80.0000 mL | Freq: Once | INTRAVENOUS | Status: AC | PRN
Start: 2019-11-17 — End: 2019-11-17
  Administered 2019-11-17: 14:00:00 80 mL via INTRAVENOUS

## 2019-11-17 MED ORDER — NITROGLYCERIN 0.4 MG SL SUBL: 0.4000 mg | SUBLINGUAL_TABLET | Freq: Once | SUBLINGUAL | Status: DC

## 2019-11-17 MED ORDER — NITROGLYCERIN 0.4 MG SL SUBL
SUBLINGUAL_TABLET | SUBLINGUAL | Status: AC
  Filled 2019-11-17: qty 2

## 2019-11-17 MED ORDER — SODIUM CHLORIDE 0.9 % IV BOLUS
500.0000 mL | Freq: Once | INTRAVENOUS | Status: AC
  Administered 2019-11-17: 13:00:00 500 mL via INTRAVENOUS

## 2019-11-17 NOTE — Progress Notes (Signed)
Having chest pain that started this am. Starts on right side of chest and then moves to left. Was 8/10 sharp pain on arrival to nurses station and is now 5/10. Gets this same type of pain daily and has been occurring for about 8 months. Color is pink and skin is warm and dry. Denies SOB or radiation of pain. Does have numbness in right arm and hand on and off but not currently. Is not distressed.

## 2019-11-17 NOTE — Progress Notes (Signed)
BP borderline for 2 nitros for CT heart. Dr Odis Hollingshead aware. For 500cc bolus and then give 2 nitros for test.

## 2019-11-17 NOTE — Progress Notes (Signed)
Was having IV inserted by Nicolasa Ducking RN using ultrasound. Patient had short episode of Vagal response when became diaphoretic, HR in 40's amd BP 87/49. Feels better now, diaphoresis resolved< heart rate 59 and BP 104 61. Awaiting CT table for scan. Chest pain still 5-6/10 as before.

## 2019-11-22 NOTE — Progress Notes (Signed)
No calcium/blockages seen in coronary arteries.  Thanks MJP

## 2019-11-22 NOTE — Progress Notes (Signed)
Relayed information to patient. Patient voiced understanding.  

## 2019-11-27 DIAGNOSIS — R911 Solitary pulmonary nodule: Secondary | ICD-10-CM | POA: Insufficient documentation

## 2019-11-27 DIAGNOSIS — E782 Mixed hyperlipidemia: Secondary | ICD-10-CM | POA: Insufficient documentation

## 2019-11-27 NOTE — Progress Notes (Signed)
Patient referred by Leonard Downing, * for chest pain  Subjective:   Tyler Allen, male    DOB: [birth year], 54 y.o.   MRN: [id number]   Chief Complaint  Patient presents with  . Chest pain of uncertain etiology  . Follow-up     HPI  54 y.o. Caucasian male with COPD, former smoker, type 2 DM, family h/o early CAD, chest pain.  CTA discussed with the patient, details below. Patient continues to have left chest wall pain.   Initial consultation 10/2019: Patient has been disabled since 1991 due to work related back injury. His physical activity is limited to walking to grocery stores etc. For last months, he has been experiencing chest pain. Pain is retrosternal, starts on right side, radiates to left side. Pain only relieves when e lays down. He has stable exertional dyspnea, attributed to COPD. He quit smoking in 2018.    Current Outpatient Medications on File Prior to Visit  Medication Sig Dispense Refill  . acetaminophen-codeine (TYLENOL #4) 300-60 MG tablet Take 1 tablet by mouth every 4 (four) hours as needed for pain.    Marland Kitchen albuterol (PROVENTIL) (5 MG/ML) 0.5% nebulizer solution Take 2.5 mg by nebulization every 6 (six) hours as needed for wheezing or shortness of breath.    Marland Kitchen aspirin EC 81 MG tablet Take 1 tablet (81 mg total) by mouth daily. Swallow whole. 30 tablet 2  . cephALEXin (KEFLEX) 500 MG capsule Take 1 capsule (500 mg total) by mouth 2 (two) times daily. 14 capsule 0  . HYDROcodone-acetaminophen (NORCO/VICODIN) 5-325 MG tablet Take 1 tablet by mouth every 4 (four) hours as needed for moderate pain. 20 tablet 0  . lidocaine (LIDODERM) 5 % 2 patches daily.    Marland Kitchen lovastatin (MEVACOR) 20 MG tablet Take 20 mg by mouth at bedtime.    . metFORMIN (GLUCOPHAGE) 1000 MG tablet Take 1 tablet by mouth in the morning and at bedtime.    . metoprolol tartrate (LOPRESSOR) 25 MG tablet Take 1 tablet (25 mg total) by mouth 2 (two) times daily. 60 tablet 2  .  nitroGLYCERIN (NITROSTAT) 0.4 MG SL tablet Place 1 tablet (0.4 mg total) under the tongue every 5 (five) minutes as needed for chest pain. 30 tablet 2  . tamsulosin (FLOMAX) 0.4 MG CAPS capsule Take 0.8 mg by mouth daily.    . traZODone (DESYREL) 150 MG tablet Take 1 tablet by mouth daily.     No current facility-administered medications on file prior to visit.    Cardiovascular and other pertinent studies:  1. Coronary calcium score of 0. This was 0 percentile for age and sex matched control. 2. Normal coronary origin with right dominance. 3. CAD-RADS = 0.  No evidence of CAD (0%). Consider non-atherosclerotic causes of chest pain.  Non-cardiac findings: 1. No acute findings in the imaged extracardiac chest. 2. Right upper lobe pulmonary nodules of 4 mm x 2. No follow-up needed if patient is low-risk. Non-contrast chest CT can be considered in 12 months if patient is high-risk. This recommendation follows the consensus statement: Guidelines for Management of Incidental Pulmonary Nodules Detected on CT Images: From the Fleischner Society 2017; Radiology 2017; 284:228-243.   EKG 10/21/2019: Sinus rhythm 88 bpm  Borderline left atrial dilatation   Recent labs: 11/01/2019: Glucose 95, BUN/Cr 21/0.84. EGFR normal. Na/K 138/4.3.  Chol 229, TG 168, HDL 37, LDL 161   Review of Systems  Cardiovascular: Positive for chest pain. Negative for dyspnea on exertion, leg  swelling, palpitations and syncope.         Vitals:   12/01/19 0950  BP: 124/80  Pulse: 84  Resp: 16  SpO2: 97%     Body mass index is 36.33 kg/m. Filed Weights   12/01/19 0950  Weight: 246 lb (111.6 kg)     Objective:   Physical Exam Vitals and nursing note reviewed.  Constitutional:      General: He is not in acute distress. Neck:     Vascular: No JVD.  Cardiovascular:     Rate and Rhythm: Normal rate and regular rhythm.     Heart sounds: Normal heart sounds. No murmur heard.   Pulmonary:      Effort: Pulmonary effort is normal.     Breath sounds: Normal breath sounds. No wheezing or rales.         Assessment & Recommendations:   54 y.o. Caucasian male with COPD, former smoker, type 2 DM, family h/o early CAD, chest pain.  Chest pain: No CAD on CTA. Calcium score 0. Chest pain likely noncardiac, may be musculoskeletal.  Okay to stop Aspirin, metoprolol  Hyperlipidemia: LDL 161 on lovastatin 20 mg. Switch to rosuvastatin 20 mg.   Lung nodule: RUL lung nodule 4X2 mm. Recommend annual imaging. Will defer follow up to PCP.   Nigel Mormon, MD Pager: [phone] Office: [phone]

## 2019-12-01 ENCOUNTER — Other Ambulatory Visit: Payer: Self-pay

## 2019-12-01 ENCOUNTER — Ambulatory Visit: Payer: Medicare Other | Admitting: Cardiology

## 2019-12-01 ENCOUNTER — Encounter: Payer: Self-pay | Admitting: Cardiology

## 2019-12-01 VITALS — BP 124/80 | HR 84 | Resp 16 | Ht 69.0 in | Wt 246.0 lb

## 2019-12-01 DIAGNOSIS — R0789 Other chest pain: Secondary | ICD-10-CM

## 2019-12-01 DIAGNOSIS — R911 Solitary pulmonary nodule: Secondary | ICD-10-CM

## 2019-12-01 DIAGNOSIS — E782 Mixed hyperlipidemia: Secondary | ICD-10-CM

## 2019-12-01 MED ORDER — ROSUVASTATIN CALCIUM 20 MG PO TABS: 20.0000 mg | ORAL_TABLET | Freq: Every day | ORAL | 2 refills | Status: DC

## 2020-01-11 ENCOUNTER — Other Ambulatory Visit: Payer: Self-pay | Admitting: Cardiology

## 2020-01-11 DIAGNOSIS — Z8249 Family history of ischemic heart disease and other diseases of the circulatory system: Secondary | ICD-10-CM

## 2020-01-11 DIAGNOSIS — R0789 Other chest pain: Secondary | ICD-10-CM

## 2020-01-15 ENCOUNTER — Other Ambulatory Visit: Payer: Self-pay | Admitting: Cardiology

## 2020-01-15 DIAGNOSIS — R0789 Other chest pain: Secondary | ICD-10-CM

## 2020-01-15 DIAGNOSIS — Z8249 Family history of ischemic heart disease and other diseases of the circulatory system: Secondary | ICD-10-CM

## 2020-02-24 ENCOUNTER — Other Ambulatory Visit: Payer: Self-pay | Admitting: Cardiology

## 2020-02-24 DIAGNOSIS — E782 Mixed hyperlipidemia: Secondary | ICD-10-CM

## 2020-05-26 ENCOUNTER — Other Ambulatory Visit: Payer: Self-pay | Admitting: Cardiology

## 2020-05-26 DIAGNOSIS — E782 Mixed hyperlipidemia: Secondary | ICD-10-CM

## 2020-08-28 ENCOUNTER — Other Ambulatory Visit: Payer: Self-pay | Admitting: Cardiology

## 2020-08-28 DIAGNOSIS — E782 Mixed hyperlipidemia: Secondary | ICD-10-CM

## 2020-11-26 ENCOUNTER — Other Ambulatory Visit: Payer: Self-pay | Admitting: Cardiology

## 2020-11-26 DIAGNOSIS — E782 Mixed hyperlipidemia: Secondary | ICD-10-CM

## 2021-05-17 ENCOUNTER — Other Ambulatory Visit: Payer: Self-pay | Admitting: Family Medicine

## 2021-05-17 DIAGNOSIS — F17210 Nicotine dependence, cigarettes, uncomplicated: Secondary | ICD-10-CM

## 2021-05-30 ENCOUNTER — Other Ambulatory Visit: Payer: Self-pay | Admitting: Cardiology

## 2021-05-30 DIAGNOSIS — E782 Mixed hyperlipidemia: Secondary | ICD-10-CM

## 2021-06-11 ENCOUNTER — Ambulatory Visit
Admission: RE | Admit: 2021-06-11 | Discharge: 2021-06-11 | Disposition: A | Discharge status: Home / Self Care | Payer: Medicare Other | Source: Ambulatory Visit | Attending: Family Medicine | Admitting: Family Medicine

## 2021-06-11 DIAGNOSIS — F17210 Nicotine dependence, cigarettes, uncomplicated: Secondary | ICD-10-CM

## 2021-08-30 ENCOUNTER — Other Ambulatory Visit: Payer: Self-pay | Admitting: Cardiology

## 2021-08-30 DIAGNOSIS — E782 Mixed hyperlipidemia: Secondary | ICD-10-CM

## 2021-11-22 ENCOUNTER — Other Ambulatory Visit: Payer: Self-pay | Admitting: Family Medicine

## 2021-11-22 DIAGNOSIS — R911 Solitary pulmonary nodule: Secondary | ICD-10-CM

## 2021-11-24 ENCOUNTER — Other Ambulatory Visit: Payer: Self-pay | Admitting: Cardiology

## 2021-11-24 DIAGNOSIS — E782 Mixed hyperlipidemia: Secondary | ICD-10-CM

## 2022-01-18 ENCOUNTER — Ambulatory Visit
Admission: RE | Admit: 2022-01-18 | Discharge: 2022-01-18 | Disposition: A | Discharge status: Home / Self Care | Payer: Medicare Other | Source: Ambulatory Visit | Attending: Family Medicine | Admitting: Family Medicine

## 2022-01-18 DIAGNOSIS — R911 Solitary pulmonary nodule: Secondary | ICD-10-CM

## 2022-02-14 ENCOUNTER — Other Ambulatory Visit: Payer: Self-pay

## 2022-02-14 ENCOUNTER — Encounter (HOSPITAL_BASED_OUTPATIENT_CLINIC_OR_DEPARTMENT_OTHER): Payer: Self-pay | Admitting: Urology

## 2022-02-14 ENCOUNTER — Emergency Department (HOSPITAL_BASED_OUTPATIENT_CLINIC_OR_DEPARTMENT_OTHER): Payer: Medicare Other

## 2022-02-14 ENCOUNTER — Emergency Department (HOSPITAL_BASED_OUTPATIENT_CLINIC_OR_DEPARTMENT_OTHER)
Admission: EM | Admit: 2022-02-14 | Discharge: 2022-02-14 | Disposition: A | Discharge status: Home / Self Care | Payer: Medicare Other | Attending: Emergency Medicine | Admitting: Emergency Medicine

## 2022-02-14 DIAGNOSIS — Z79899 Other long term (current) drug therapy: Secondary | ICD-10-CM | POA: Diagnosis not present

## 2022-02-14 DIAGNOSIS — R6889 Other general symptoms and signs: Secondary | ICD-10-CM

## 2022-02-14 DIAGNOSIS — E119 Type 2 diabetes mellitus without complications: Secondary | ICD-10-CM | POA: Diagnosis not present

## 2022-02-14 DIAGNOSIS — K921 Melena: Secondary | ICD-10-CM | POA: Diagnosis present

## 2022-02-14 DIAGNOSIS — Z7984 Long term (current) use of oral hypoglycemic drugs: Secondary | ICD-10-CM | POA: Insufficient documentation

## 2022-02-14 DIAGNOSIS — R058 Other specified cough: Secondary | ICD-10-CM | POA: Diagnosis not present

## 2022-02-14 DIAGNOSIS — Z1152 Encounter for screening for COVID-19: Secondary | ICD-10-CM | POA: Diagnosis not present

## 2022-02-14 DIAGNOSIS — I10 Essential (primary) hypertension: Secondary | ICD-10-CM | POA: Insufficient documentation

## 2022-02-14 DIAGNOSIS — R112 Nausea with vomiting, unspecified: Secondary | ICD-10-CM | POA: Diagnosis not present

## 2022-02-14 DIAGNOSIS — J449 Chronic obstructive pulmonary disease, unspecified: Secondary | ICD-10-CM | POA: Diagnosis not present

## 2022-02-14 LAB — RESP PANEL BY RT-PCR (RSV, FLU A&B, COVID)  RVPGX2
Influenza A by PCR: NEGATIVE
Influenza B by PCR: NEGATIVE
Resp Syncytial Virus by PCR: NEGATIVE
SARS Coronavirus 2 by RT PCR: NEGATIVE

## 2022-02-14 LAB — CBC WITH DIFFERENTIAL/PLATELET
Abs Immature Granulocytes: 0.02 10*3/uL (ref 0.00–0.07)
Basophils Absolute: 0.1 10*3/uL (ref 0.0–0.1)
Basophils Relative: 1 %
Eosinophils Absolute: 0 10*3/uL (ref 0.0–0.5)
Eosinophils Relative: 0 %
HCT: 47.7 % (ref 39.0–52.0)
Hemoglobin: 17 g/dL (ref 13.0–17.0)
Immature Granulocytes: 0 %
Lymphocytes Relative: 22 %
Lymphs Abs: 1.4 10*3/uL (ref 0.7–4.0)
MCH: 32.3 pg (ref 26.0–34.0)
MCHC: 35.6 g/dL (ref 30.0–36.0)
MCV: 90.7 fL (ref 80.0–100.0)
Monocytes Absolute: 0.9 10*3/uL (ref 0.1–1.0)
Monocytes Relative: 13 %
Neutro Abs: 4.3 10*3/uL (ref 1.7–7.7)
Neutrophils Relative %: 64 %
Platelets: 250 10*3/uL (ref 150–400)
RBC: 5.26 MIL/uL (ref 4.22–5.81)
RDW: 12.2 % (ref 11.5–15.5)
WBC: 6.6 10*3/uL (ref 4.0–10.5)
nRBC: 0 % (ref 0.0–0.2)

## 2022-02-14 LAB — COMPREHENSIVE METABOLIC PANEL
ALT: 32 U/L (ref 0–44)
AST: 35 U/L (ref 15–41)
Albumin: 3.9 g/dL (ref 3.5–5.0)
Alkaline Phosphatase: 59 U/L (ref 38–126)
Anion gap: 12 (ref 5–15)
BUN: 23 mg/dL — ABNORMAL HIGH (ref 6–20)
CO2: 20 mmol/L — ABNORMAL LOW (ref 22–32)
Calcium: 8 mg/dL — ABNORMAL LOW (ref 8.9–10.3)
Chloride: 98 mmol/L (ref 98–111)
Creatinine, Ser: 1.08 mg/dL (ref 0.61–1.24)
GFR, Estimated: >60 mL/min (ref >60)
Glucose, Bld: 97 mg/dL (ref 70–99)
Potassium: 3.5 mmol/L (ref 3.5–5.1)
Sodium: 130 mmol/L — ABNORMAL LOW (ref 135–145)
Total Bilirubin: 0.4 mg/dL (ref 0.3–1.2)
Total Protein: 7.4 g/dL (ref 6.5–8.1)

## 2022-02-14 LAB — LIPASE, BLOOD: Lipase: 32 U/L (ref 11–51)

## 2022-02-14 LAB — OCCULT BLOOD X 1 CARD TO LAB, STOOL: Fecal Occult Bld: NEGATIVE

## 2022-02-14 MED ORDER — ONDANSETRON 4 MG PO TBDP: 4.0000 mg | ORAL_TABLET | Freq: Three times a day (TID) | ORAL | 0 refills | Status: AC | PRN

## 2022-02-14 MED ORDER — ONDANSETRON HCL 4 MG/2ML IJ SOLN
4.0000 mg | Freq: Once | INTRAMUSCULAR | Status: AC
  Administered 2022-02-14: 4 mg via INTRAVENOUS
  Filled 2022-02-14: qty 2

## 2022-02-14 MED ORDER — ALUM & MAG HYDROXIDE-SIMETH 200-200-20 MG/5ML PO SUSP
30.0000 mL | Freq: Once | ORAL | Status: AC
  Administered 2022-02-14: 30 mL via ORAL
  Filled 2022-02-14: qty 30

## 2022-02-14 MED ORDER — LIDOCAINE VISCOUS HCL 2 % MT SOLN
15.0000 mL | Freq: Once | OROMUCOSAL | Status: AC
  Administered 2022-02-14: 15 mL via ORAL
  Filled 2022-02-14: qty 15

## 2022-02-14 MED ORDER — SODIUM CHLORIDE 0.9 % IV BOLUS
1000.0000 mL | Freq: Once | INTRAVENOUS | Status: AC
  Administered 2022-02-14: 1000 mL via INTRAVENOUS

## 2022-02-14 NOTE — Discharge Instructions (Signed)
There was no blood in your stool. Your oxygen saturations are on the lower end and I suspect this is likely due to your chronic COPD.  Please get help right away if you develop any shortness of breath. Get help right away if: You have trouble breathing. You have pain or pressure in your chest. You are confused. You have bluish lips and fingernails. You have trouble waking from sleep. You have symptoms that get worse. These symptoms may be an emergency. Get help right away. Call 911. Do not wait to see if the symptoms will go away. Do not drive yourself to the hospital.

## 2022-02-14 NOTE — ED Notes (Signed)
Pt received 1000 ml of her bolus

## 2022-02-14 NOTE — ED Notes (Signed)
Topaz not working... Pt verbalized understanding

## 2022-02-14 NOTE — ED Provider Notes (Signed)
[facility] Provider Note   CSN: [id number] Arrival date & time: 02/14/22  1202     History  Chief Complaint  Patient presents with   Melena   Flu Symptoms     Tyler Allen is a 57 y.o. male with a past medical history of COPD, diabetes, hypertension, hyperlipidemia who presents emergency department with flulike symptoms.  He had onset of symptoms 5 days ago these include fever with Tmax of 102 at home, coughing, weakness, myalgia and fatigue.  Yesterday the patient began having nausea, vomiting, several episodes of diarrhea 1 of which was black and tarry.  He denies taking any Pepto-Bismol or Kaopectate.  Patient was seen by his primary care doctor who provides previous outpatient labs from 1 year ago showing normal creatinine.  Patient is still nauseous but has not vomited since 4 AM and has had no liquid stools.  His friend who is at bedside also has flulike symptoms and his wife began feeling ill yesterday.  He has been vaccinated for COVID and influenza.  HPI     Home Medications Prior to Admission medications   Medication Sig Start Date End Date Taking? Authorizing Provider  acetaminophen-codeine (TYLENOL #4) 300-60 MG tablet Take 1 tablet by mouth every 4 (four) hours as needed for pain.    [provider]  albuterol (PROVENTIL) (5 MG/ML) 0.5% nebulizer solution Take 2.5 mg by nebulization every 6 (six) hours as needed for wheezing or shortness of breath.    [provider]  cephALEXin (KEFLEX) 500 MG capsule Take 1 capsule (500 mg total) by mouth 2 (two) times daily. 03/12/17   Rozetta Nunnery, MD  HYDROcodone-acetaminophen (NORCO/VICODIN) 5-325 MG tablet Take 1 tablet by mouth every 4 (four) hours as needed for moderate pain. 03/12/17   Rozetta Nunnery, MD  lidocaine (LIDODERM) 5 % 2 patches daily. 10/07/19   [provider]  metFORMIN (GLUCOPHAGE) 1000 MG tablet Take 1 tablet by mouth in the morning and at  bedtime. 08/05/19   [provider]  nitroGLYCERIN (NITROSTAT) 0.4 MG SL tablet DISSOLVE 1 TABLET UNDER THE TONGUE EVERY 5 MINUTES AS NEEDED FOR CHEST PAIN 01/11/20   Patwardhan, Manish J, MD  rosuvastatin (CRESTOR) 20 MG tablet TAKE 1 TABLET(20 MG) BY MOUTH DAILY 11/27/20   Patwardhan, Manish J, MD  rosuvastatin (CRESTOR) 20 MG tablet TAKE 1 TABLET(20 MG) BY MOUTH DAILY 05/30/21   Patwardhan, Manish J, MD  tamsulosin (FLOMAX) 0.4 MG CAPS capsule Take 0.8 mg by mouth daily. 08/05/19   [provider]  traZODone (DESYREL) 150 MG tablet Take 1 tablet by mouth daily. 09/17/19   [provider]      Allergies    Patient has no known allergies.    Review of Systems   Review of Systems  Physical Exam Updated Vital Signs BP (!) 150/87 (BP Location: Left Arm)   Pulse (!) 104   Temp 97.7 F (36.5 C) (Oral)   Resp 18   Ht 5\' 9"  (1.753 m)   Wt 111.6 kg   SpO2 93%   BMI 36.33 kg/m  Physical Exam Vitals and nursing note reviewed.  Constitutional:      General: He is not in acute distress.    Appearance: He is well-developed. He is ill-appearing. He is not toxic-appearing or diaphoretic.  HENT:     Head: Normocephalic and atraumatic.  Eyes:     General: No scleral icterus.    Conjunctiva/sclera: Conjunctivae normal.  Cardiovascular:  Rate and Rhythm: Normal rate and regular rhythm.     Heart sounds: Normal heart sounds.  Pulmonary:     Effort: Pulmonary effort is normal. No respiratory distress.     Breath sounds: Normal breath sounds. No wheezing.  Abdominal:     Palpations: Abdomen is soft.     Tenderness: There is no abdominal tenderness.  Musculoskeletal:     Cervical back: Normal range of motion and neck supple.  Skin:    General: Skin is warm and dry.  Neurological:     Mental Status: He is alert.  Psychiatric:        Behavior: Behavior normal.     ED Results / Procedures / Treatments   Labs (all labs ordered are listed, but only abnormal  results are displayed) Labs Reviewed  COMPREHENSIVE METABOLIC PANEL - Abnormal; Notable for the following components:      Result Value   Sodium 130 (*)    CO2 20 (*)    BUN 23 (*)    Calcium 8.0 (*)    All other components within normal limits  RESP PANEL BY RT-PCR (RSV, FLU A&B, COVID)  [id number]  CBC WITH DIFFERENTIAL/PLATELET    EKG None  Radiology No results found.  Procedures Procedures    Medications Ordered in ED Medications - No data to display  ED Course/ Medical Decision Making/ A&P Clinical Course as of 02/16/22 1835  Thu Feb 14, 2022  1836 Occult blood card to lab, stool [AH]  1837 SpO2: 93 % Suspect baseline 02 w/ hx copd. No cp or sob [AH]    Clinical Course User Index [AH] Margarita Mail, PA-C                           Medical Decision Making Patient with symptoms consistent with flu like sxs and dark stool No abd pain.  Suspect viral uri. Doubt colitis or diverticulitis. Benign abd exam.   Labs- negative viral panel and otherwise no sig findings Hemoccult negative  Fluids given and patient feels improved  Amount and/or Complexity of Data Reviewed Independent Historian: friend External Data Reviewed: labs.    Details: Paper copy form pcp Labs: ordered. Decision-making details documented in ED Course. Radiology: ordered and independent interpretation performed.    Details: I personally visualized and interpreted the images using our PACS system. Acute findings include:  No acute findings.   Risk OTC drugs. Prescription drug management. Risk Details: 02 is low- baseline per patient            Final Clinical Impression(s) / ED Diagnoses Final diagnoses:  None    Rx / DC Orders ED Discharge Orders     None         Margarita Mail, PA-C 02/16/22 [place], Blessing, DO 02/18/22 [id number]

## 2022-02-14 NOTE — ED Triage Notes (Signed)
Pt states flu like symptoms since Saturday  States cough, runny nose, body aches  States black stools noted this am, N/V since this am   Sent from [facility]

## 2022-07-14 IMAGING — CT CT CHEST LUNG CANCER SCREENING LOW DOSE W/O CM
2 of 5 series · 15 of 40 positions shown, 18 images · non-contrast
Comparison: 11/17/2019 coronary CTA.  No prior screening CT.

CLINICAL DATA: Thirty pack-year smoking history/quit 6 years ago



[Series 4: lung 1.00 br44 cor · coronal · 0.67mm/px · 3 of 412 slices shown]
[im 83/412  lung]
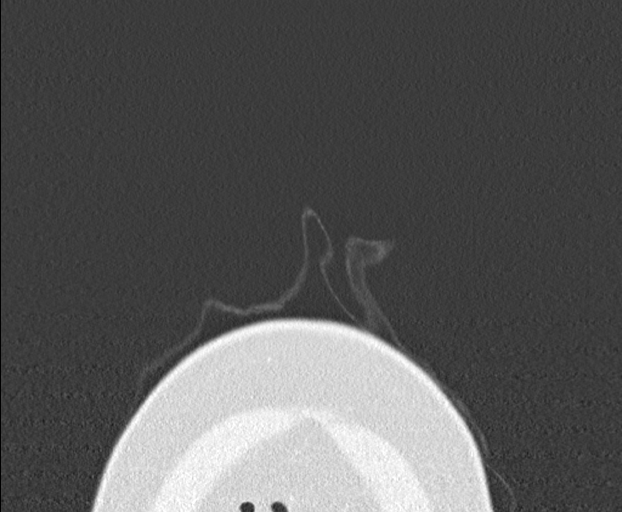
[im 165/412  lung]
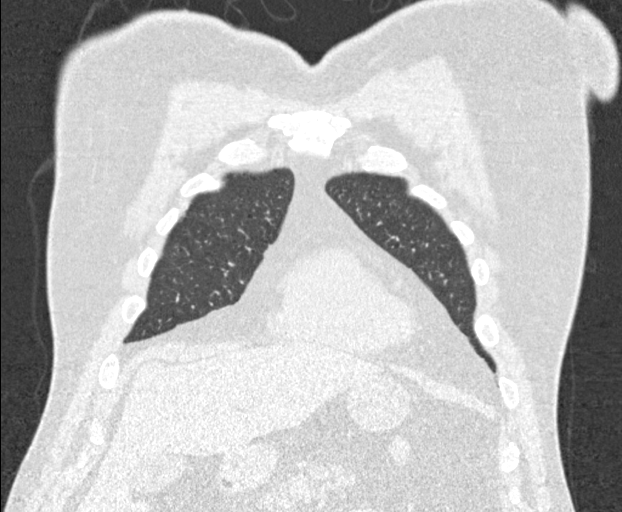
[im 247/412  lung]
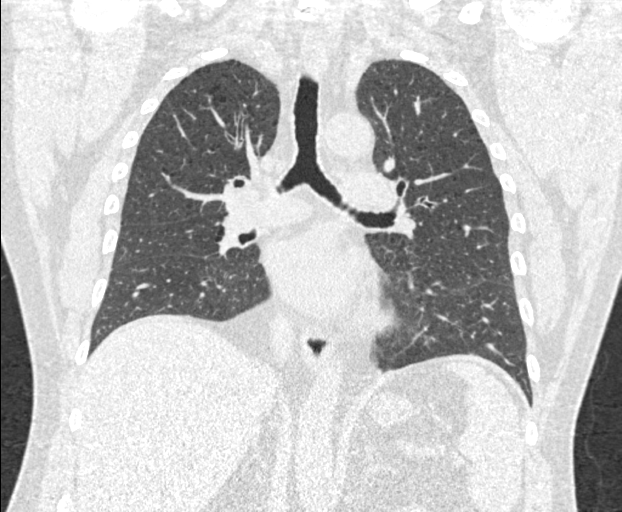

[Series 9: lung 1.00 br60 axial · axial · 0.81mm/px · z∈[-1160,-847]mm · 12 of 345 slices shown, 15 images]
[im 16/345  mediastinal]
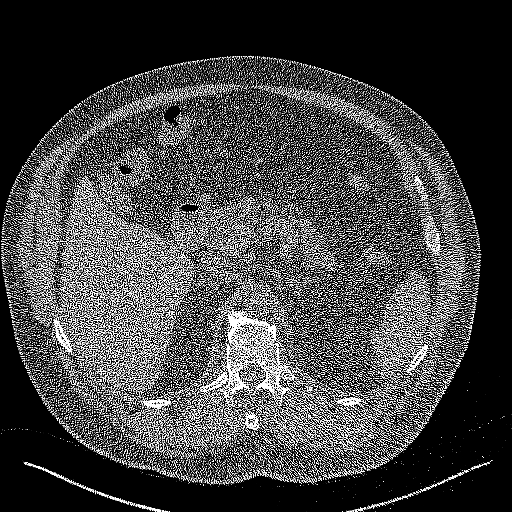
[im 16/345  lung]
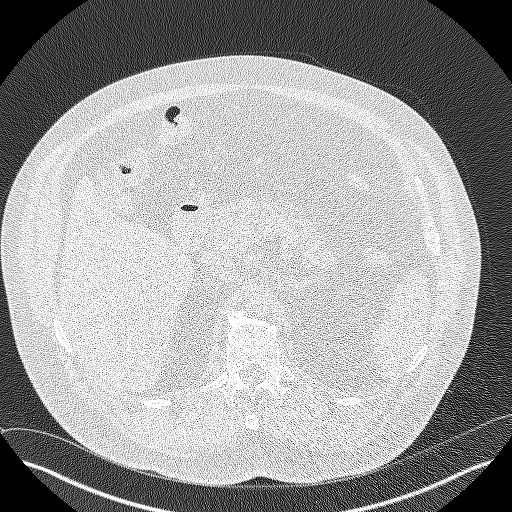
[im 47/345  lung]
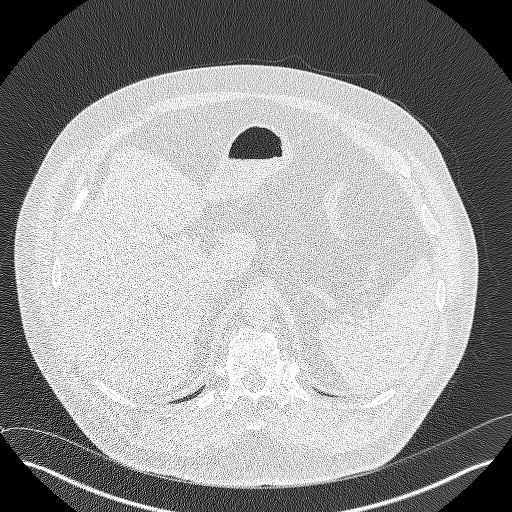
[im 79/345  lung]
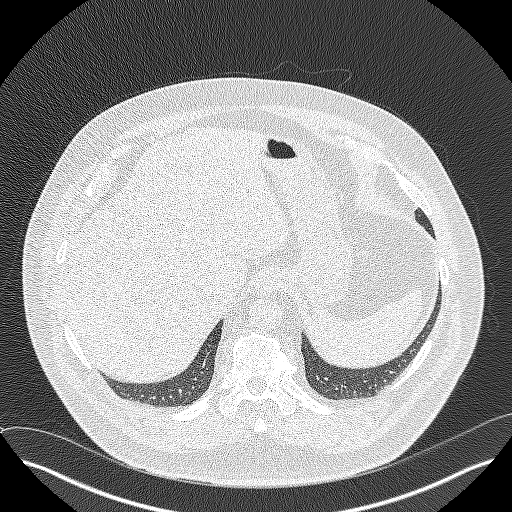
[im 110/345  lung]
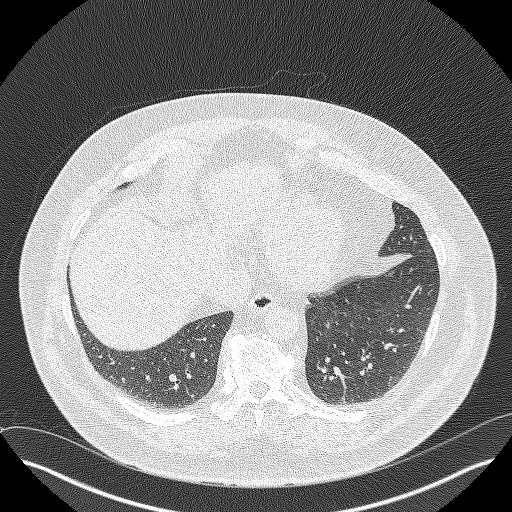
[im 126/345  mediastinal]
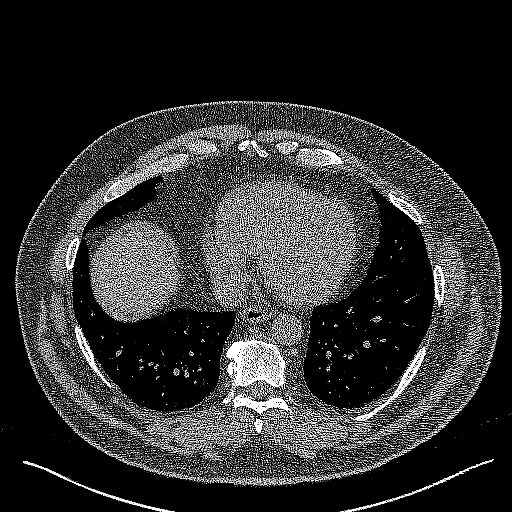
[im 126/345  lung]
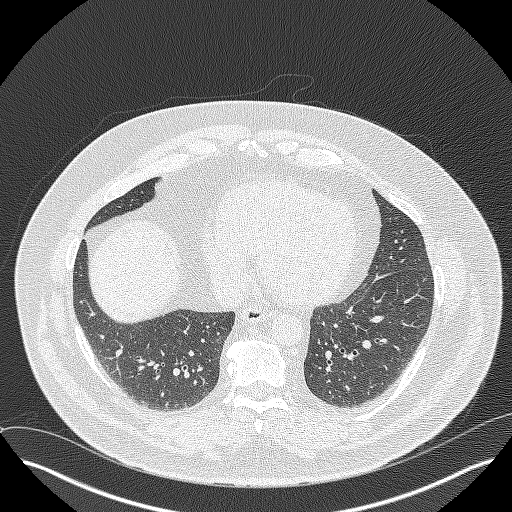
[im 157/345  lung]
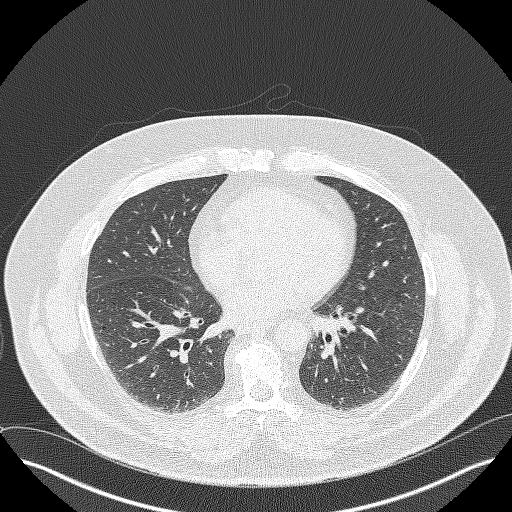
[im 188/345  lung]
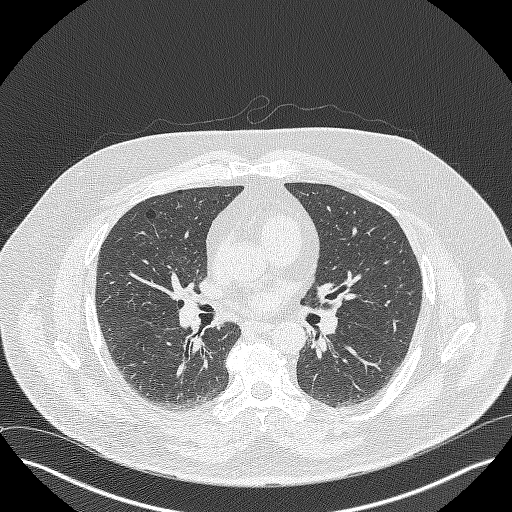
[im 219/345  lung]
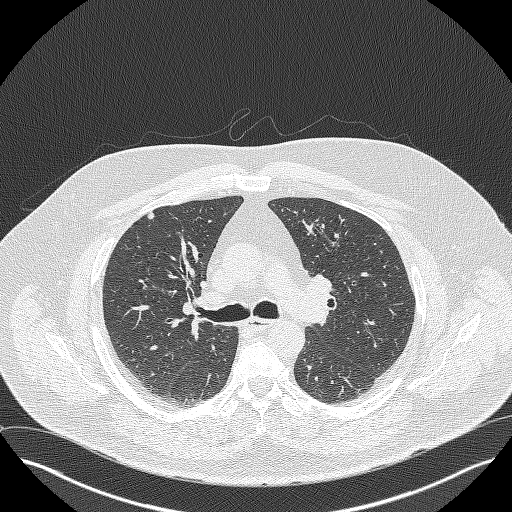
[im 235/345  mediastinal]
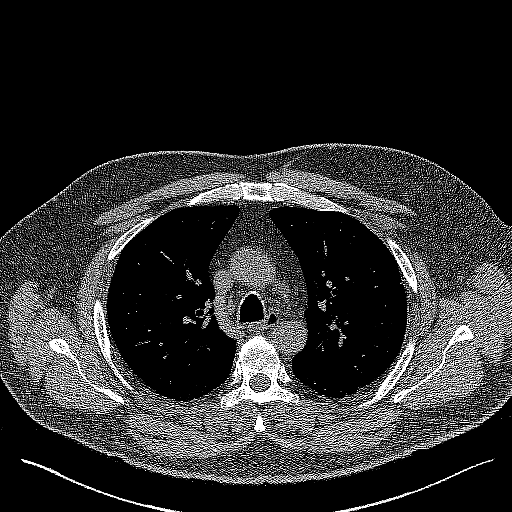
[im 235/345  lung]
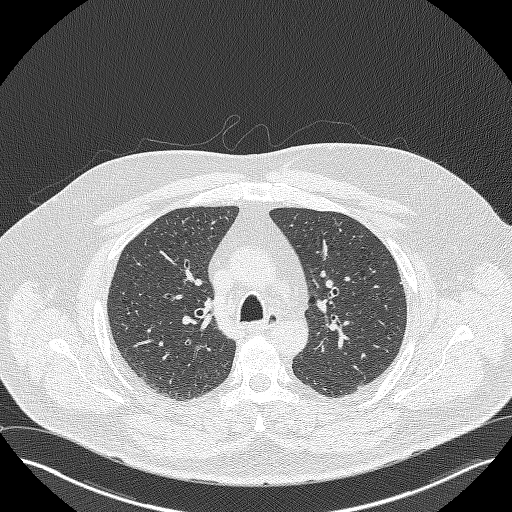
[im 266/345  lung]
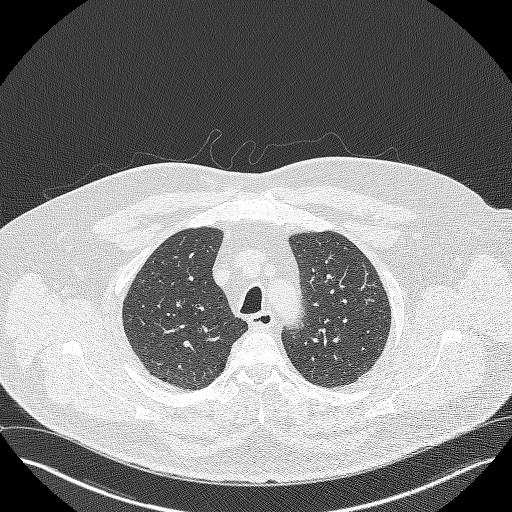
[im 298/345  lung]
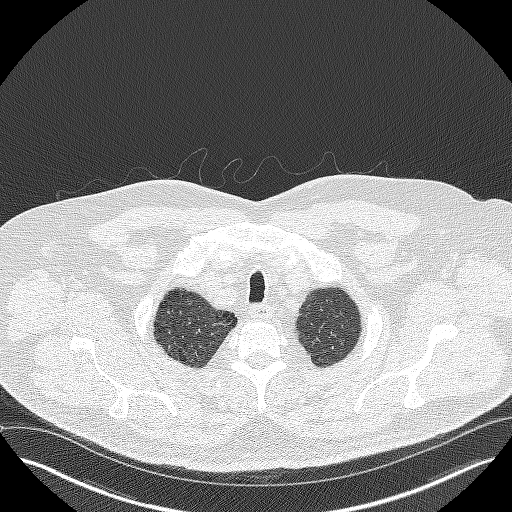
[im 329/345  lung]
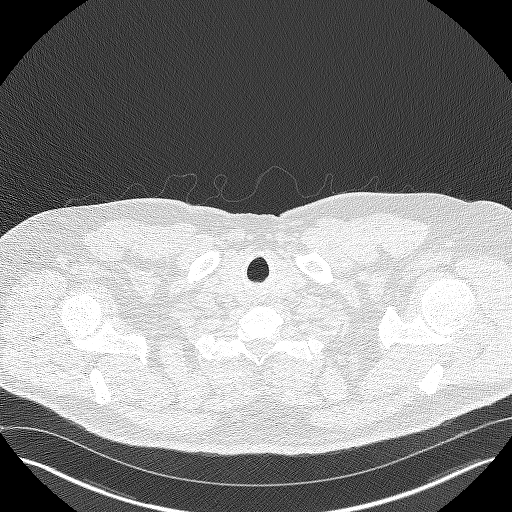

[15 of 40 positions shown; findings below may reference images not displayed]

FINDINGS: Cardiovascular: Aortic atherosclerosis. Normal heart size, without
pericardial effusion.

Mediastinum/Nodes: No mediastinal or definite hilar adenopathy,
given limitations of unenhanced CT.

Lungs/Pleura: No pleural fluid. Moderate centrilobular emphysema.
Bilateral pulmonary nodules. The largest is in the subpleural right
upper lobe at volume derived equivalent diameter 7.2 mm on image
126. This was present on 11/27/2019, but may have enlarged minimally
since then.

Upper Abdomen: Nonspecific mild caudate lobe prominence. Normal
imaged portions of the spleen, stomach, pancreas, gallbladder,
adrenal glands, left kidney.

Musculoskeletal: No acute osseous abnormality.
IMPRESSION: Lung-RADS 3, probably benign findings. Short-term follow-up in 6
months is recommended with repeat low-dose chest CT without contrast
(please use the following order, "CT CHEST LCS NODULE FOLLOW-UP W/O
CM"). Subpleural right upper lobe pulmonary nodule of volume derived
equivalent diameter 7.2 mm.

Aortic Atherosclerosis (HG25E-XIA.A) and Emphysema (HG25E-MNO.1).
